# Patient Record
Sex: Female | Born: 1985 | Race: Black or African American | Hispanic: No | Marital: Single
Health system: Southern US, Community
[De-identification: ages and names within clinical notes are randomized; demographics above are authoritative.]

---

## 2014-12-13 ENCOUNTER — Other Ambulatory Visit: Payer: Self-pay | Admitting: Physician Assistant

## 2014-12-13 DIAGNOSIS — R319 Hematuria, unspecified: Secondary | ICD-10-CM

## 2014-12-20 ENCOUNTER — Ambulatory Visit
Admission: RE | Admit: 2014-12-20 | Discharge: 2014-12-20 | Disposition: A | Discharge status: Home / Self Care | Payer: No Typology Code available for payment source | Source: Ambulatory Visit | Attending: Physician Assistant | Admitting: Physician Assistant

## 2014-12-20 DIAGNOSIS — R319 Hematuria, unspecified: Secondary | ICD-10-CM | POA: Diagnosis present

## 2014-12-26 ENCOUNTER — Ambulatory Visit: Payer: Self-pay

## 2016-03-14 ENCOUNTER — Telehealth: Payer: Self-pay

## 2016-03-14 NOTE — Telephone Encounter (Signed)
Pt is calling to see if labs were mailed.

## 2016-03-20 NOTE — Telephone Encounter (Signed)
Pt called again today to see if labs were mailed. Labs had to be retrieved from [facility] for review as they were not in the [facility]. Pt was seen on 02/29/16. Left msg for pt that labs have been mailed as of today.

## 2016-04-04 ENCOUNTER — Telehealth: Payer: Self-pay

## 2016-04-04 NOTE — Telephone Encounter (Signed)
Pt had neg gon/chlam/trich. Neg HIV, RPR, Hep C. Pt has type 2 genital herpes (from 2007) so no need to test for that. She had full panel done. RN to explain to pt. Thx.

## 2016-04-04 NOTE — Telephone Encounter (Signed)
Pt calling.  She received lab results she requested.  However, she thought she got the full STD panel.  She received the cxs but the blood work only has HIV and Hep C.  Did she not receive the full report?

## 2016-04-18 ENCOUNTER — Telehealth: Payer: Self-pay

## 2016-04-18 NOTE — Telephone Encounter (Signed)
Pt calling.  She refilled her valtrex but it was only a 3d supply.  If sxs persist, dies she have to call in another refill order or does rx needto be changed?  [phone]

## 2016-04-19 ENCOUNTER — Other Ambulatory Visit: Payer: Self-pay | Admitting: Obstetrics and Gynecology

## 2016-04-19 MED ORDER — VALACYCLOVIR HCL 500 MG PO TABS: ORAL_TABLET | ORAL | 1 refills | Status: AC

## 2016-04-19 NOTE — Progress Notes (Unsigned)
valtrr

## 2016-04-19 NOTE — Telephone Encounter (Signed)
I see it was last Rxd 9/17. I'm not sure why they only gave her 3 days worth. I re-prescribed it for #30, but it may be due to her insurance. She doesn't need appt if she needs refill, as long as she keeps current on her annuals. RN to notify pt.

## 2016-04-22 NOTE — Telephone Encounter (Signed)
Left detailed msg.

## 2017-06-06 IMAGING — CT CT ABD-PELV W/O CM
2 of 4 series · 16 of 46 positions shown, 18 images · non-contrast
Comparison: None.

CLINICAL DATA: Acute left flank pain and microscopic hematuria.

EXAM:
CT ABDOMEN AND PELVIS WITHOUT CONTRAST
TECHNIQUE: Multidetector CT imaging of the abdomen and pelvis was performed
following the standard protocol without IV contrast.

[Series 3: stone · axial · 0.64mm/px · z∈[-918,-548]mm · 13 of 82 slices shown, 15 images]
[im 4/82  soft-tissue]
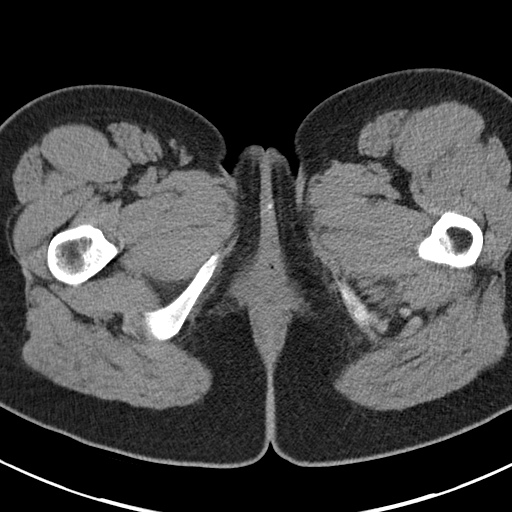
[im 4/82  bone]
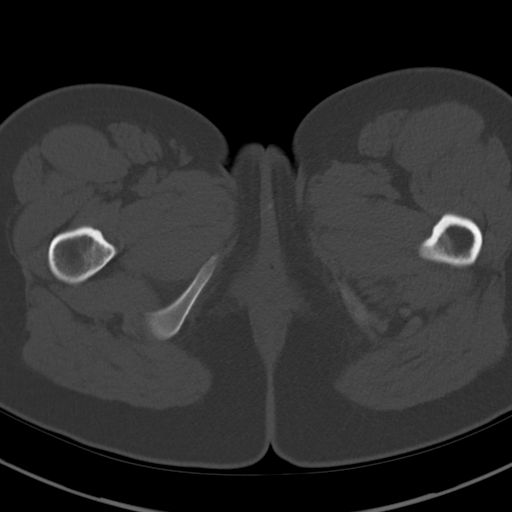
[im 10/82  soft-tissue]
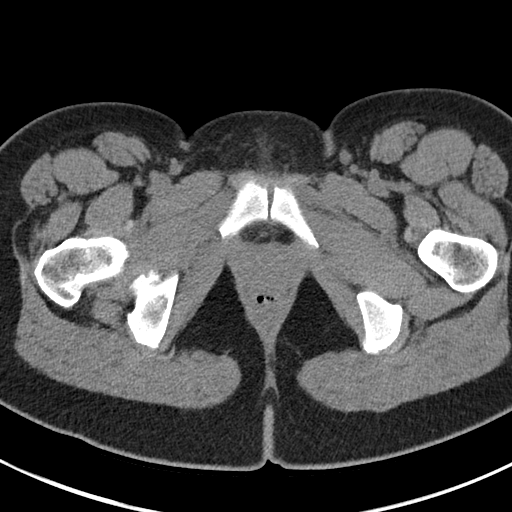
[im 16/82  soft-tissue]
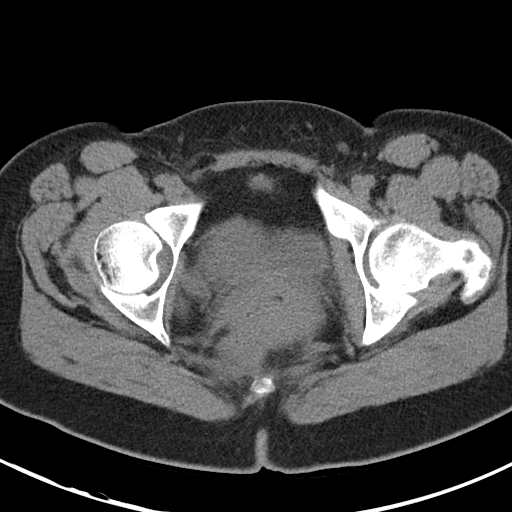
[im 22/82  soft-tissue]
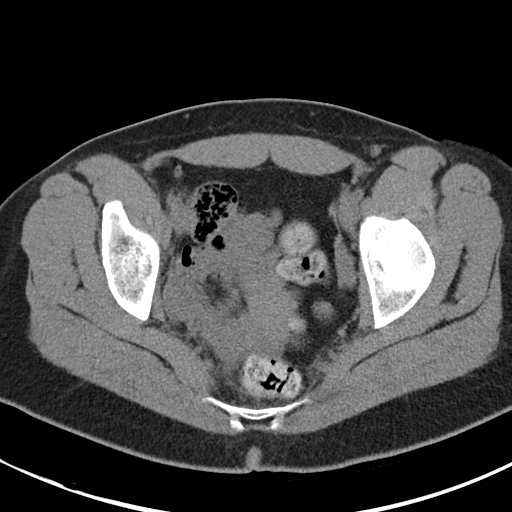
[im 29/82  soft-tissue]
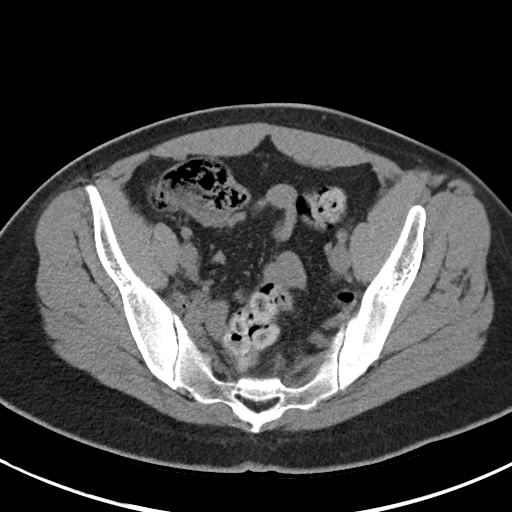
[im 35/82  soft-tissue]
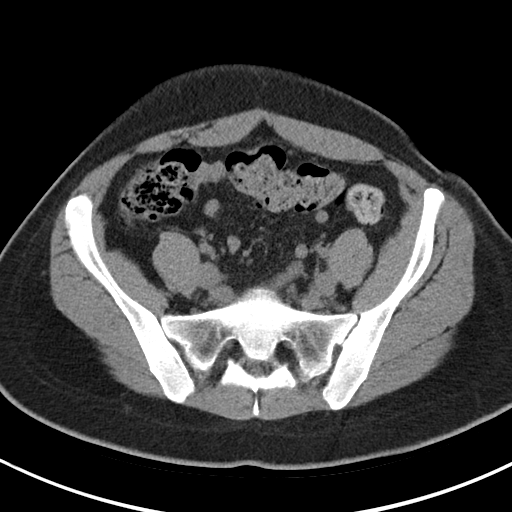
[im 41/82  soft-tissue]
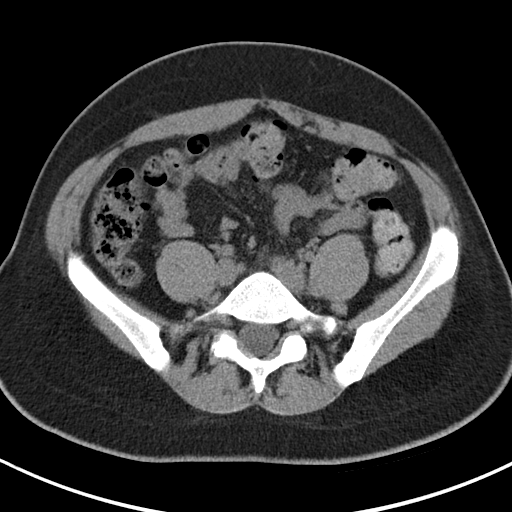
[im 47/82  soft-tissue]
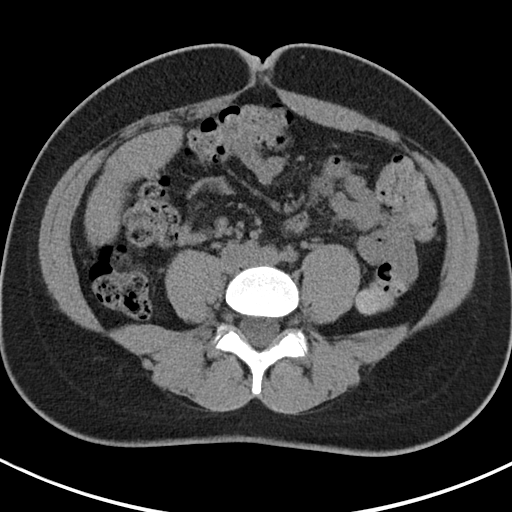
[im 53/82  soft-tissue]
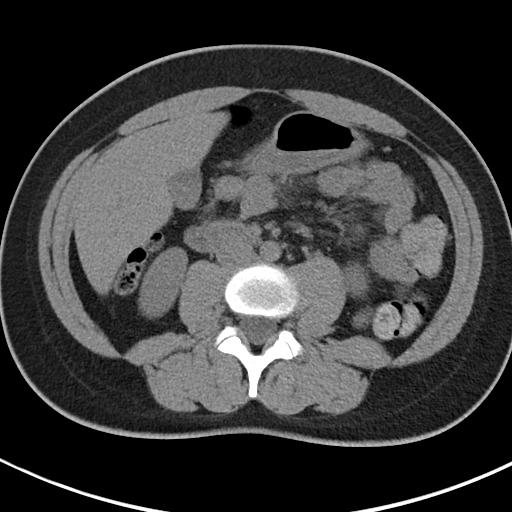
[im 53/82  bone]
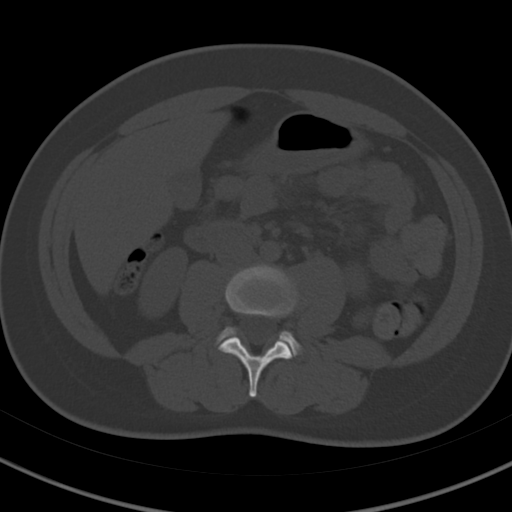
[im 60/82  soft-tissue]
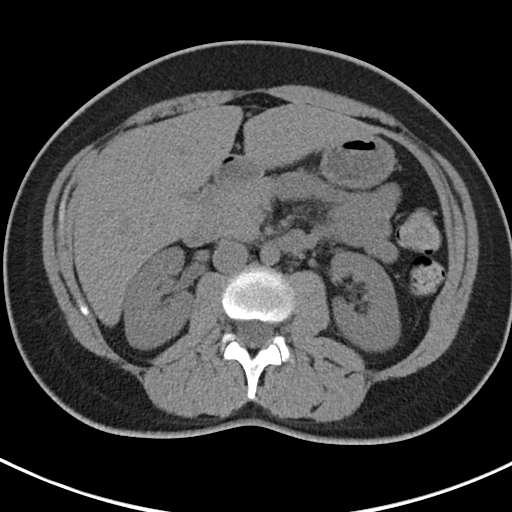
[im 66/82  soft-tissue]
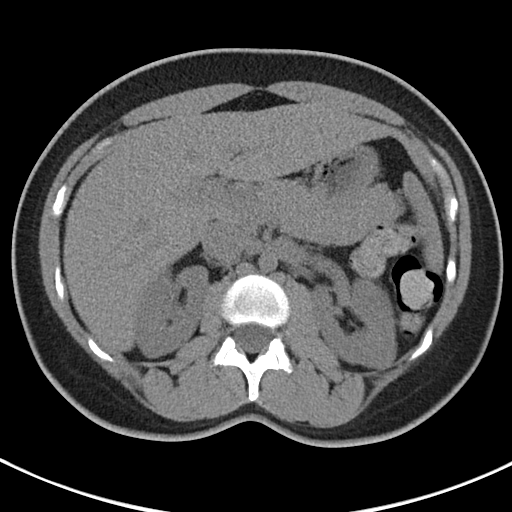
[im 72/82  soft-tissue]
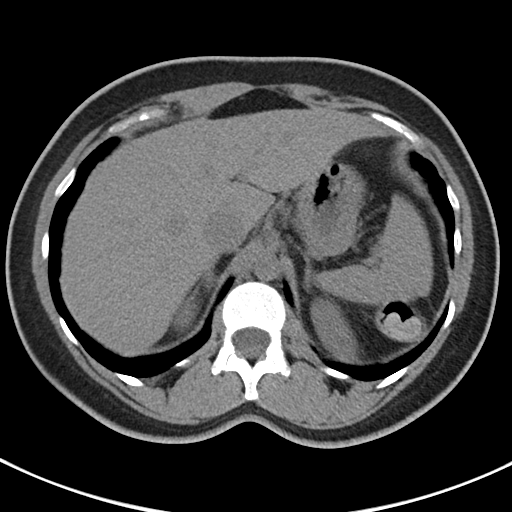
[im 78/82  soft-tissue]
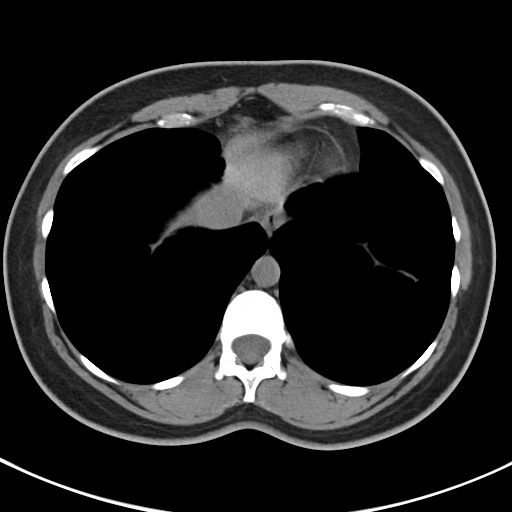

[Series 6: cor · coronal · 0.65mm/px · 3 of 146 slices shown]
[im 49/146  soft-tissue]
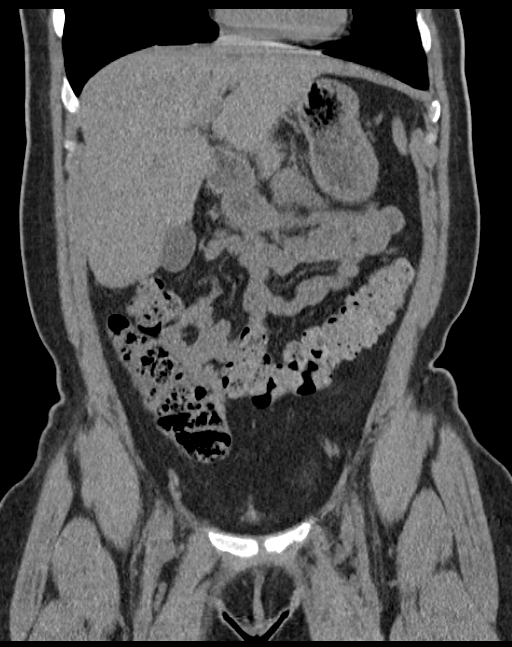
[im 65/146  soft-tissue]
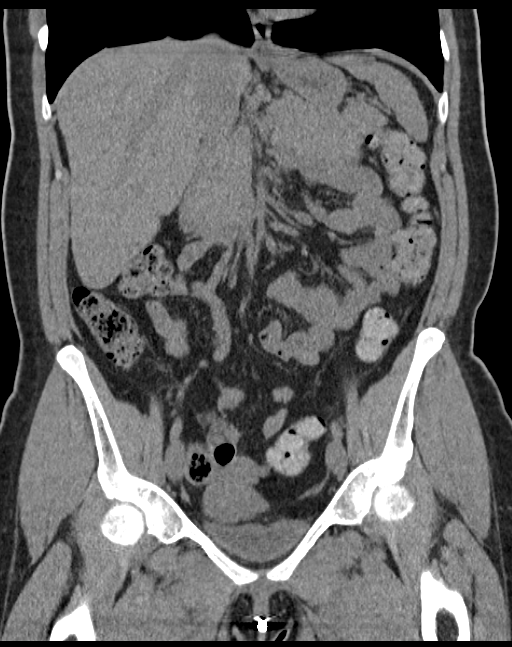
[im 81/146  soft-tissue]
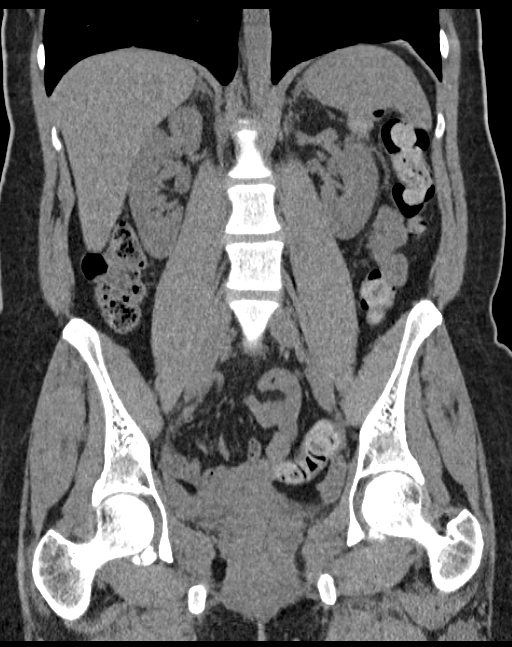

[16 of 46 positions shown; findings below may reference images not displayed]

FINDINGS: Visualized lung bases appear intact. No significant osseous
abnormality is noted.

No gallstones are noted. The liver, spleen and pancreas demonstrate
no focal abnormality on these unenhanced images. Adrenal glands and
kidneys appear normal. No hydronephrosis or renal obstruction is
noted. No renal or ureteral calculi are noted. The appendix appears
normal. There is no evidence of bowel obstruction. No abnormal fluid
collection is noted. Urinary bladder is decompressed. Uterus and
ovaries are unremarkable. There is no abnormal fluid collection. No
significant adenopathy is noted.
IMPRESSION: No hydronephrosis or renal obstruction. No renal or ureteral calculi
are noted. No definite abnormality seen in the abdomen or pelvis.
